# Patient Record
Sex: Male | Born: 1985 | Race: Black or African American | Hispanic: No | Marital: Single | Smoking: Current some day smoker
Health system: Southern US, Community
[De-identification: ages and names within clinical notes are randomized; demographics above are authoritative.]

## PROBLEM LIST (undated history)

## (undated) DIAGNOSIS — E119 Type 2 diabetes mellitus without complications: Secondary | ICD-10-CM

## (undated) DIAGNOSIS — I10 Essential (primary) hypertension: Secondary | ICD-10-CM

## (undated) DIAGNOSIS — E7800 Pure hypercholesterolemia, unspecified: Secondary | ICD-10-CM

## (undated) HISTORY — PX: TONSILLECTOMY: SUR1361

## (undated) HISTORY — PX: TYMPANOSTOMY TUBE PLACEMENT: SHX32

---

## 2017-03-24 ENCOUNTER — Emergency Department (HOSPITAL_BASED_OUTPATIENT_CLINIC_OR_DEPARTMENT_OTHER)
Admission: EM | Admit: 2017-03-24 | Discharge: 2017-03-24 | Disposition: A | Discharge status: Home / Self Care | Payer: Medicaid Other | Attending: Emergency Medicine | Admitting: Emergency Medicine

## 2017-03-24 ENCOUNTER — Encounter (HOSPITAL_BASED_OUTPATIENT_CLINIC_OR_DEPARTMENT_OTHER): Payer: Self-pay | Admitting: *Deleted

## 2017-03-24 DIAGNOSIS — E119 Type 2 diabetes mellitus without complications: Secondary | ICD-10-CM | POA: Diagnosis not present

## 2017-03-24 DIAGNOSIS — E118 Type 2 diabetes mellitus with unspecified complications: Secondary | ICD-10-CM

## 2017-03-24 DIAGNOSIS — Z87891 Personal history of nicotine dependence: Secondary | ICD-10-CM | POA: Diagnosis not present

## 2017-03-24 DIAGNOSIS — N4889 Other specified disorders of penis: Secondary | ICD-10-CM | POA: Diagnosis present

## 2017-03-24 DIAGNOSIS — N471 Phimosis: Secondary | ICD-10-CM | POA: Diagnosis not present

## 2017-03-24 DIAGNOSIS — I10 Essential (primary) hypertension: Secondary | ICD-10-CM | POA: Diagnosis not present

## 2017-03-24 HISTORY — DX: Essential (primary) hypertension: I10

## 2017-03-24 HISTORY — DX: Pure hypercholesterolemia, unspecified: E78.00

## 2017-03-24 LAB — URINALYSIS, ROUTINE W REFLEX MICROSCOPIC
BILIRUBIN URINE: NEGATIVE
Glucose, UA: >=500 mg/dL — AB
HGB URINE DIPSTICK: NEGATIVE
Ketones, ur: NEGATIVE mg/dL
Leukocytes, UA: NEGATIVE
Nitrite: NEGATIVE
PROTEIN: NEGATIVE mg/dL
Specific Gravity, Urine: 1.04 — ABNORMAL HIGH (ref 1.005–1.030)
pH: 5.5 (ref 5.0–8.0)

## 2017-03-24 LAB — CBG MONITORING, ED: Glucose-Capillary: 268 mg/dL — ABNORMAL HIGH (ref 65–99)

## 2017-03-24 LAB — URINALYSIS, MICROSCOPIC (REFLEX)

## 2017-03-24 MED ORDER — MUPIROCIN CALCIUM 2 % EX CREA: 1.0000 "application " | TOPICAL_CREAM | Freq: Three times a day (TID) | CUTANEOUS | 0 refills | Status: DC

## 2017-03-24 MED ORDER — OXYCODONE-ACETAMINOPHEN 5-325 MG PO TABS: 1.0000 | ORAL_TABLET | Freq: Four times a day (QID) | ORAL | 0 refills | Status: DC | PRN

## 2017-03-24 MED ORDER — LIDOCAINE 4 % EX CREA
TOPICAL_CREAM | CUTANEOUS | Status: AC
  Administered 2017-03-24: 1
  Filled 2017-03-24: qty 5

## 2017-03-24 MED ORDER — HYDROMORPHONE HCL 1 MG/ML IJ SOLN
1.0000 mg | Freq: Once | INTRAMUSCULAR | Status: AC
Start: 2017-03-24 — End: 2017-03-24
  Administered 2017-03-24: 1 mg via INTRAMUSCULAR
  Filled 2017-03-24: qty 1

## 2017-03-24 MED ORDER — OXYCODONE-ACETAMINOPHEN 5-325 MG PO TABS
2.0000 | ORAL_TABLET | Freq: Once | ORAL | Status: AC
  Administered 2017-03-24: 2 via ORAL
  Filled 2017-03-24: qty 2

## 2017-03-24 MED ORDER — LIDOCAINE-PRILOCAINE 2.5-2.5 % EX CREA
TOPICAL_CREAM | Freq: Once | CUTANEOUS | Status: DC
  Filled 2017-03-24: qty 5

## 2017-03-24 NOTE — ED Notes (Addendum)
Paged Dr. Isabel CapriceGrapey for consult @ [phone]

## 2017-03-24 NOTE — Discharge Instructions (Signed)
Apply ointment to skin on tip of penis 3 times daily for 1 week. Follow up with urology clinic. [facility] immediately if worsening symptoms or if you cannot urinate. FOLLOW UP WITH A PRIMARY CARE PROVIDER REGARDING YOUR DIABETES.

## 2017-03-24 NOTE — ED Provider Notes (Signed)
[facility] Provider Note   CSN: [id number] Arrival date & time: 03/24/17  1703  By signing my name below, I, Nathaniel Payne, attest that this documentation has been prepared under the direction and in the presence of Little, Ambrose Finland, MD. Electronically Signed: Rosario Payne, ED Scribe. 03/24/17. 6:06 PM.  History   Chief Complaint Chief Complaint  Patient presents with  . Groin Swelling   The history is provided by the patient. No language interpreter was used.    HPI Comments: Nathaniel Payne is a 31 y.o. male with a h/o HTN/HLD, who presents to the Emergency Department complaining of worsening penile pain and swelling beginning this afternoon approximately five hours ago. Per pt, he was bathing around 1PM today and pulled his foreskin back when it became stuck. He reports that prior to this for the past several days his foreskin has been tighter and not as easily retractable, but today after pulling it back it became struck with increased pain around the area. He attempted to massage the area, place A&D ointment, and soak it in peroxide and was able to pull the foreskin back in place; however, he notes that the interior portion of the foreskin "split" in several places. He took a nap shortly following but after waking up the area had become increasingly painful. He attempted to urinate at that time and reports that he had some difficulty beginning his stream and passing urine. He was able to pass the full void eventually.  Pt's pain is worse with manipulation of the foreskin and distal penis. No recent trauma to the groin. No h/o similar. No h/o DM. He denies penile discharge, dysuria, hematuria, or any other associated symptoms.   FH: father with Diabetes Mellitus Past Medical History:  Diagnosis Date  . Hypercholesteremia   . Hypertension    There are no active problems to display for this patient.  Past Surgical History:  Procedure Laterality Date  .  TONSILLECTOMY    . TYMPANOSTOMY TUBE PLACEMENT      Home Medications    Prior to Admission medications   Medication Sig Start Date End Date Taking? Authorizing Provider  mupirocin cream (BACTROBAN) 2 % Apply 1 application topically 3 (three) times daily. To tip of penis, for seven days 03/24/17   Little, Ambrose Finland, MD  oxyCODONE-acetaminophen (PERCOCET) 5-325 MG tablet Take 1-2 tablets by mouth every 6 (six) hours as needed for severe pain. 03/24/17   Little, Ambrose Finland, MD   Family History No family history on file.  Social History Social History  Substance Use Topics  . Smoking status: Former Games developer  . Smokeless tobacco: Never Used  . Alcohol use No   Allergies   Penicillins  Review of Systems Review of Systems A complete review of systems was obtained and all systems are negative except as noted in the HPI and PMH.   Physical Exam Updated Vital Signs BP (!) 138/103   Pulse 86   Temp 98.2 F (36.8 C) (Oral)   Resp 20   Ht 5\' 11"  (1.803 m)   SpO2 98%   Physical Exam  Constitutional: He is oriented to person, place, and time. He appears well-developed and well-nourished. He appears distressed.  HENT:  Head: Normocephalic and atraumatic.  Eyes: Conjunctivae are normal.  Neck: Neck supple.  Genitourinary:  Genitourinary Comments: Uncircumcised penis with edema of distal foreskin. Unable to fully retract foreskin with significant tenderness. Several splits in skin in internal foreskin. Normal appearing testes.  Neurological: He is alert and oriented to person, place, and time.  Skin: Skin is warm and dry.  Psychiatric: He has a normal mood and affect. Judgment normal.  Nursing note and vitals reviewed.  ED Treatments / Results  DIAGNOSTIC STUDIES: Oxygen Saturation is 98% on RA, normal by my interpretation.   COORDINATION OF CARE: 6:06 PM-Discussed next steps with pt. Pt verbalized understanding and is agreeable with the plan.   Labs (all labs ordered are  listed, but only abnormal results are displayed) Labs Reviewed  URINALYSIS, ROUTINE W REFLEX MICROSCOPIC - Abnormal; Notable for the following:       Result Value   Specific Gravity, Urine 1.040 (*)    Glucose, UA >=500 (*)    All other components within normal limits  URINALYSIS, MICROSCOPIC (REFLEX) - Abnormal; Notable for the following:    Bacteria, UA MANY (*)    Squamous Epithelial / LPF 0-5 (*)    All other components within normal limits  CBG MONITORING, ED - Abnormal; Notable for the following:    Glucose-Capillary 268 (*)    All other components within normal limits    EKG  EKG Interpretation None      Radiology No results found.  Procedures Procedures   Medications Ordered in ED Medications  lidocaine-prilocaine (EMLA) cream ( Topical Not Given 03/24/17 1824)  lidocaine (LMX) 4 % cream (1 application  Given 03/24/17 1824)  oxyCODONE-acetaminophen (PERCOCET/ROXICET) 5-325 MG per tablet 2 tablet (2 tablets Oral Given 03/24/17 1855)  HYDROmorphone (DILAUDID) injection 1 mg (1 mg Intramuscular Given 03/24/17 2017)    Initial Impression / Assessment and Plan / ED Course  I have reviewed the triage vital signs and the nursing notes.  Pertinent labs that were available during my care of the patient were reviewed by me and considered in my medical decision making (see chart for details).    Pt With foreskin that became stuck retracted after bathing. He was able to reduce it prior to arrival but has had ongoing severe pain. On exam, his foreskin was completely reduced, able to partially visualize tip of the glans but limited due to pain. He had several tears in the internal portion of foreskin. Applied EMLA and ice, gave Percocet initially and later IM Dilaudid.  The patient has been able to void here, UA showing large amount of glucose therefore obtained point-of-care blood glucose which was 268. I informed the patient that he is likely diabetic and it is very important for him  to follow closely with a primary care provider to initiate appropriate treatment. He has voiced understanding. I discussed his case with the urologist on call, Dr. Isabel Caprice, appreciate assistance. He agreed that it appeared foreskin was appropriately reduced found recommended follow-up in the urology clinic for discussion of definitive management. I did provide the patient with mupirocin ointment and extensively reviewed return cautions regarding signs of infection. Patient voiced understanding of treatment and follow-up plan and was discharged in satisfactory condition.  Final Clinical Impressions(s) / ED Diagnoses   Final diagnoses:  Phimosis  Type 2 diabetes mellitus with complication, without long-term current use of insulin (HCC)   New Prescriptions Discharge Medication List as of 03/24/2017  8:50 PM    START taking these medications   Details  mupirocin cream (BACTROBAN) 2 % Apply 1 application topically 3 (three) times daily. To tip of penis, for seven days, Starting Sun 03/24/2017, Print    oxyCODONE-acetaminophen (PERCOCET) 5-325 MG tablet Take 1-2 tablets by mouth every 6 (six)  hours as needed for severe pain., Starting Sun 03/24/2017, Print       I personally performed the services described in this documentation, which was scribed in my presence. The recorded information has been reviewed and is accurate.    Little, Ambrose Finlandachel Morgan, MD 03/24/17 2112

## 2017-03-24 NOTE — ED Triage Notes (Signed)
Pt reports he pulled foreskin back while showering around 1300 today and hasn't been able to pull foreskin back down. Reports difficulty urinating and penile swelling.

## 2017-03-28 ENCOUNTER — Emergency Department (HOSPITAL_BASED_OUTPATIENT_CLINIC_OR_DEPARTMENT_OTHER)
Admission: EM | Admit: 2017-03-28 | Discharge: 2017-03-28 | Disposition: A | Discharge status: Home / Self Care | Payer: Medicaid Other | Attending: Physician Assistant | Admitting: Physician Assistant

## 2017-03-28 DIAGNOSIS — N481 Balanitis: Secondary | ICD-10-CM | POA: Diagnosis not present

## 2017-03-28 DIAGNOSIS — I10 Essential (primary) hypertension: Secondary | ICD-10-CM | POA: Diagnosis not present

## 2017-03-28 DIAGNOSIS — Z87891 Personal history of nicotine dependence: Secondary | ICD-10-CM | POA: Insufficient documentation

## 2017-03-28 MED ORDER — OXYCODONE-ACETAMINOPHEN 5-325 MG PO TABS
1.0000 | ORAL_TABLET | Freq: Once | ORAL | Status: AC
  Administered 2017-03-28: 1 via ORAL
  Filled 2017-03-28: qty 1

## 2017-03-28 MED ORDER — CLINDAMYCIN HCL 150 MG PO CAPS
300.0000 mg | ORAL_CAPSULE | Freq: Once | ORAL | Status: AC
  Administered 2017-03-28: 300 mg via ORAL
  Filled 2017-03-28: qty 2

## 2017-03-28 MED ORDER — FLUCONAZOLE 50 MG PO TABS
150.0000 mg | ORAL_TABLET | Freq: Once | ORAL | Status: AC
  Administered 2017-03-28: 150 mg via ORAL
  Filled 2017-03-28: qty 1

## 2017-03-28 MED ORDER — CLINDAMYCIN HCL 150 MG PO CAPS: 300.0000 mg | ORAL_CAPSULE | Freq: Three times a day (TID) | ORAL | 0 refills | Status: AC

## 2017-03-28 MED FILL — CLINDAMYCIN HCL 150 MG CAPS: 150 | 10 days supply | Qty: 60 | Fill #0

## 2017-03-28 NOTE — ED Triage Notes (Signed)
Seen here on 5/6 for infected foreskin and prescribed antibiotics. Pt states was unable to fill Rx due to cost > $100. Saw urologist on 5/7. States last pain became severe and and swelling and drainage increased. Pt pacing in room, appears very uncomfortable. States he is able to void with pain

## 2017-03-28 NOTE — ED Provider Notes (Addendum)
[facility]   CSN: [id number] Arrival date & time: 03/28/17  1059     History   Chief Complaint Chief Complaint  Patient presents with  . Penis Pain    HPI Nathaniel Payne is a 31 y.o. male.  HPI   Pt is a 31 yo male presenting with balanitis. PT diagnosed earlier this week, started on mupiceron, [facility].    Pt did not fill abx, has had worsening pain and swelling. Still able to urinate.   Past Medical History:  Diagnosis Date  . Hypercholesteremia   . Hypertension     There are no active problems to display for this patient.   Past Surgical History:  Procedure Laterality Date  . TONSILLECTOMY    . TYMPANOSTOMY TUBE PLACEMENT         Home Medications    Prior to Admission medications   Medication Sig Start Date End Date Taking? Authorizing Provider  mupirocin cream (BACTROBAN) 2 % Apply 1 application topically 3 (three) times daily. To tip of penis, for seven days 03/24/17   Little, Nathaniel Finlandachel Morgan, MD  oxyCODONE-acetaminophen (PERCOCET) 5-325 MG tablet Take 1-2 tablets by mouth every 6 (six) hours as needed for severe pain. 03/24/17   Little, Nathaniel Finlandachel Morgan, MD    Family History No family history on file.  Social History Social History  Substance Use Topics  . Smoking status: Former Games developermoker  . Smokeless tobacco: Never Used  . Alcohol use No     Allergies   Penicillins   Review of Systems Review of Systems  Constitutional: Negative for activity change.  Respiratory: Negative for shortness of breath.   Cardiovascular: Negative for chest pain.  Gastrointestinal: Negative for abdominal pain.     Physical Exam Updated Vital Signs BP (!) 143/87 (BP Location: Right Arm)   Pulse (!) 101   Temp 98.2 F (36.8 C) (Oral)   Resp 18   Ht 5\' 11"  (1.803 m)   Wt 288 lb (130.6 kg)   SpO2 97%   BMI 40.17 kg/m   Physical Exam  Constitutional: He is oriented to person, place, and time. He appears  well-nourished.  HENT:  Head: Normocephalic.  Eyes: Conjunctivae are normal.  Cardiovascular: Normal rate and regular rhythm.   Pulmonary/Chest: Effort normal and breath sounds normal.  Genitourinary:  Genitourinary Comments: Swollen penis.Unable to retract patient's forskin urinate. Evidence of  white discharge.  No evidence of spreading cellulitis.   Neurological: He is oriented to person, place, and time.  Skin: Skin is warm and dry. He is not diaphoretic.  Psychiatric: He has a normal mood and affect. His behavior is normal.     ED Treatments / Results  Labs (all labs ordered are listed, but only abnormal results are displayed) Labs Reviewed - No data to display  EKG  EKG Interpretation None       Radiology No results found.  Procedures Procedures (including critical care time)  Medications Ordered in ED Medications - No data to display   Initial Impression / Assessment and Plan / ED Course  I have reviewed the triage vital signs and the nursing notes.  Pertinent labs & imaging results that were available during my care of the patient were reviewed by me and considered in my medical decision making (see chart for details).     Patient is a 31 year old male seen 5 days ago and diagnosed with phimosis and told to follow up with urology. He followed up  and they gave him prescriptions. He did not fill the prescription. Instead he's been taking a random antibiotic he had for tooth pain earlier. He is not followed about about his diabetes either.  Patient here for increased pain. Patient does have impressive swelling to the foreskin. However he still able urinate. I called his urologistoffice. With on-call provider Dr. Edwin Cap has an appointment at 2:30. I believe that referral to this specialist iis the appropriate next step.   Final Clinical Impressions(s) / ED Diagnoses   Final diagnoses:  None    New Prescriptions New Prescriptions   No medications on file       Abelino Derrick, MD 03/28/17 1209    Abelino Derrick, MD 03/28/17 1244

## 2017-03-28 NOTE — ED Notes (Signed)
Pt directed to pharmacy to pick up Rx. Pt verbalizes understabding to see urologist at 230pm today

## 2017-03-28 NOTE — Discharge Instructions (Signed)
It is important you follow-up with urologist today 2: 30 p.m. you have the risk of losing your penis, so please start taking antibiotics and following the directions as prescribed by physician.  We have arranged for a cheaper perscription for antibiotics (15 dolalrs)

## 2020-07-19 ENCOUNTER — Emergency Department (HOSPITAL_BASED_OUTPATIENT_CLINIC_OR_DEPARTMENT_OTHER): Payer: Medicaid Other

## 2020-07-19 ENCOUNTER — Encounter (HOSPITAL_BASED_OUTPATIENT_CLINIC_OR_DEPARTMENT_OTHER): Payer: Self-pay | Admitting: *Deleted

## 2020-07-19 ENCOUNTER — Other Ambulatory Visit: Payer: Self-pay

## 2020-07-19 ENCOUNTER — Emergency Department (HOSPITAL_BASED_OUTPATIENT_CLINIC_OR_DEPARTMENT_OTHER)
Admission: EM | Admit: 2020-07-19 | Discharge: 2020-07-19 | Disposition: A | Discharge status: Home / Self Care | Payer: Medicaid Other | Attending: Emergency Medicine | Admitting: Emergency Medicine

## 2020-07-19 DIAGNOSIS — R2232 Localized swelling, mass and lump, left upper limb: Secondary | ICD-10-CM | POA: Insufficient documentation

## 2020-07-19 DIAGNOSIS — M25522 Pain in left elbow: Secondary | ICD-10-CM | POA: Diagnosis present

## 2020-07-19 DIAGNOSIS — Z5321 Procedure and treatment not carried out due to patient leaving prior to being seen by health care provider: Secondary | ICD-10-CM | POA: Diagnosis not present

## 2020-07-19 NOTE — ED Triage Notes (Signed)
C/o left elbow pain and swelling x 4 days, denies injury

## 2021-10-08 IMAGING — DX DG ELBOW COMPLETE 3+V*L*
4 series · 4 of 4 positions shown · non-contrast
Comparison: None.

CLINICAL DATA: Pain and swelling

EXAM:
LEFT ELBOW - COMPLETE 3+ VIEW

[elbow ap]
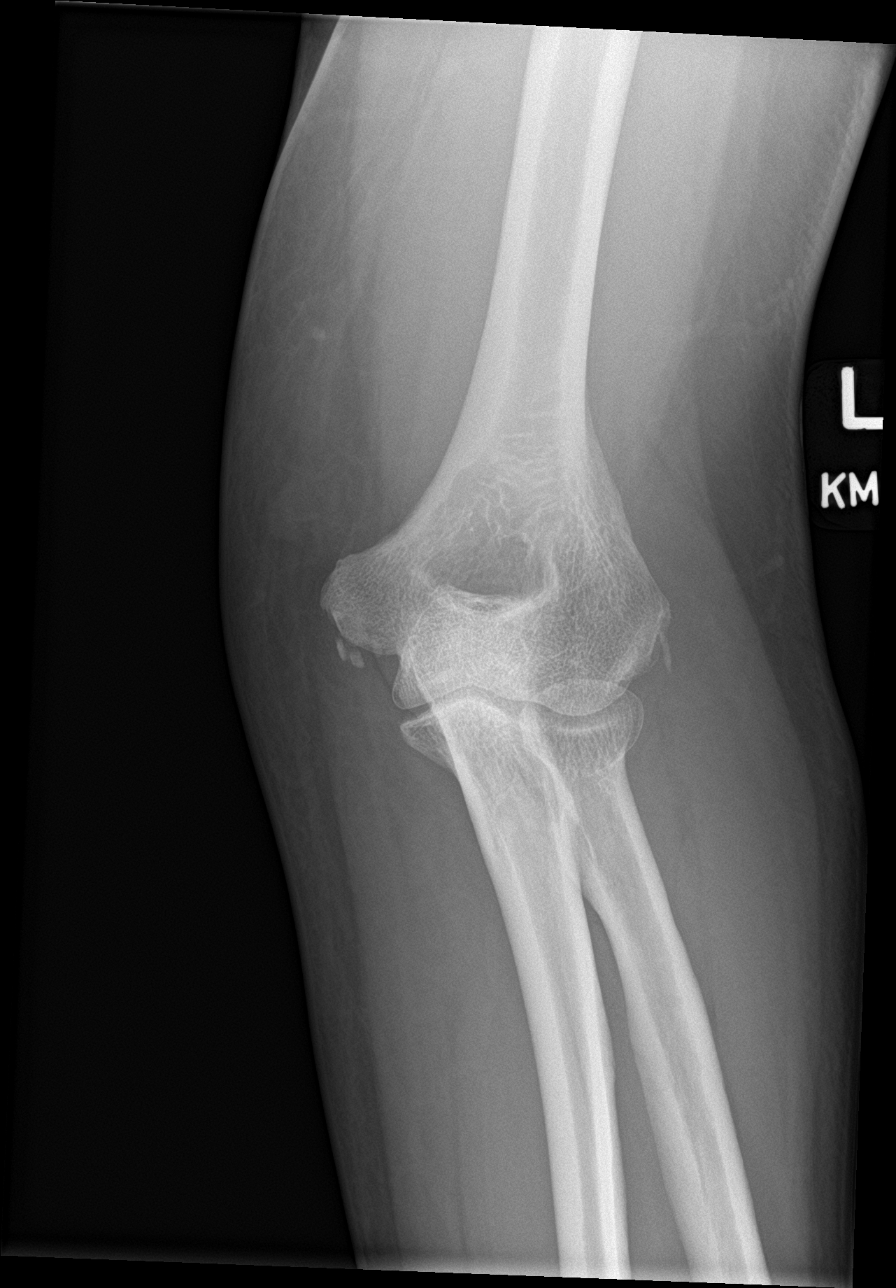

[elbow obl (1 of 2)]
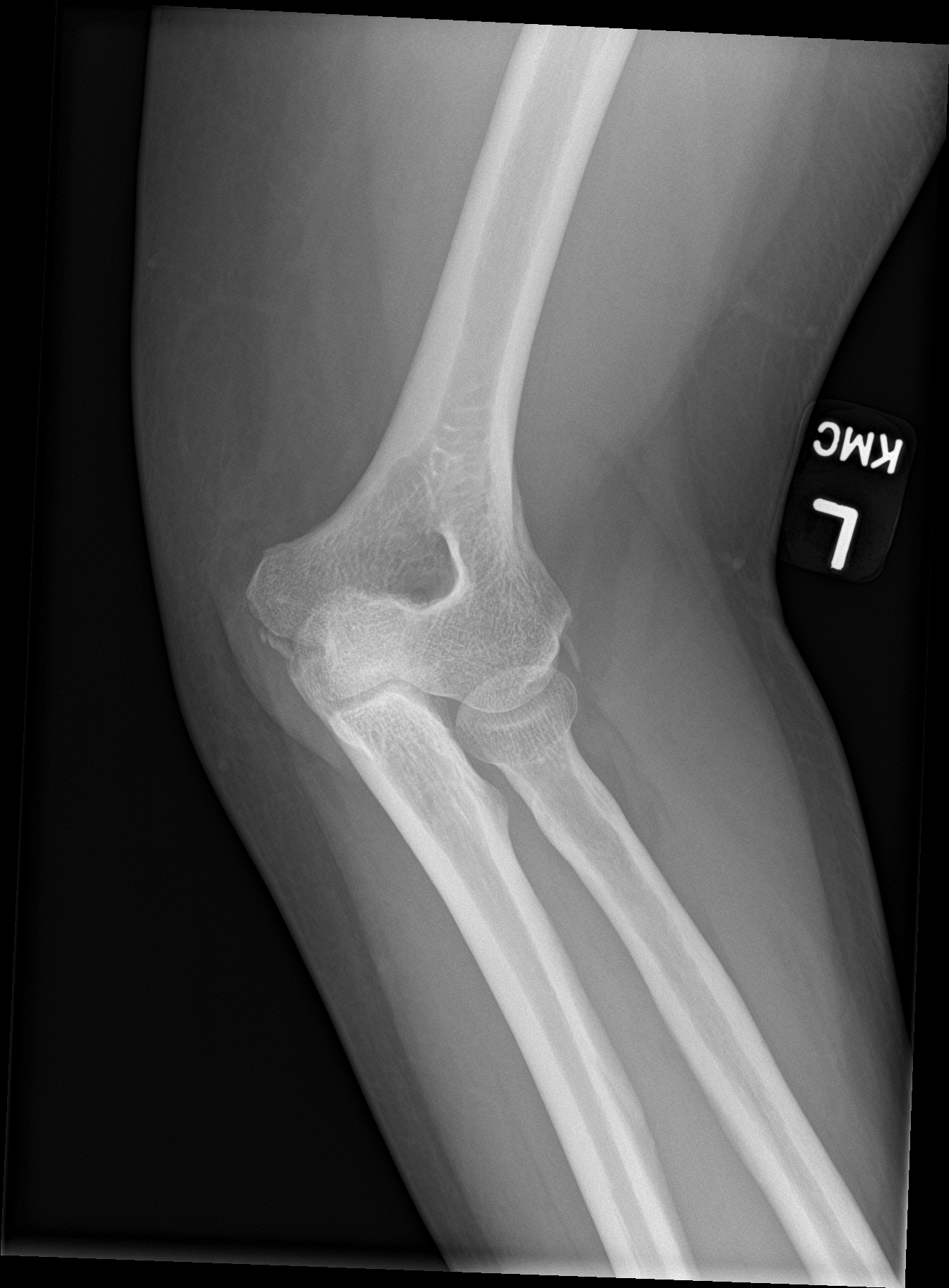

[elbow obl (2 of 2)]
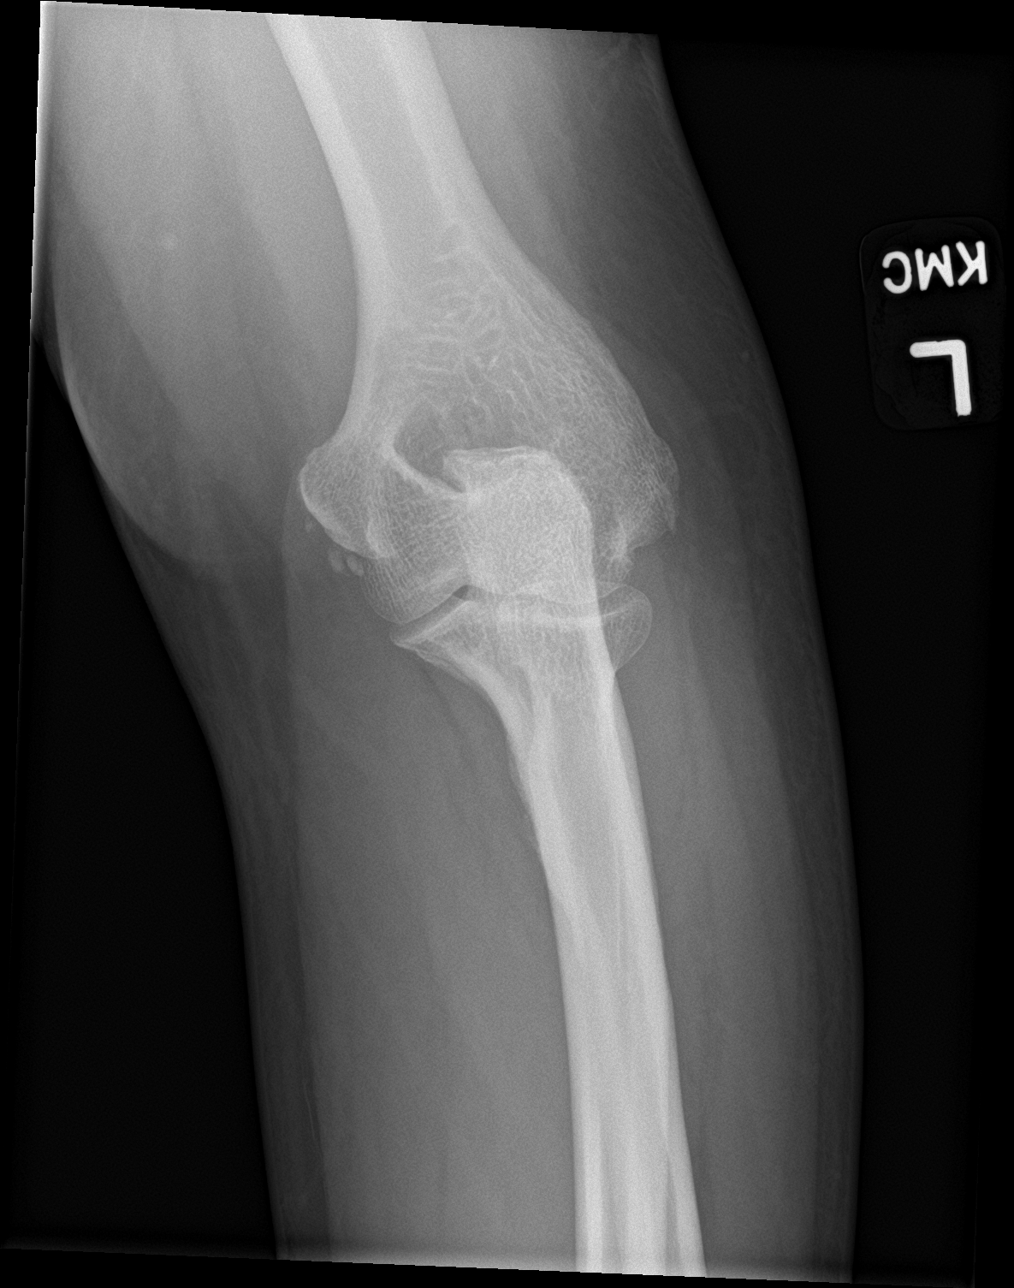

[elbow lat]
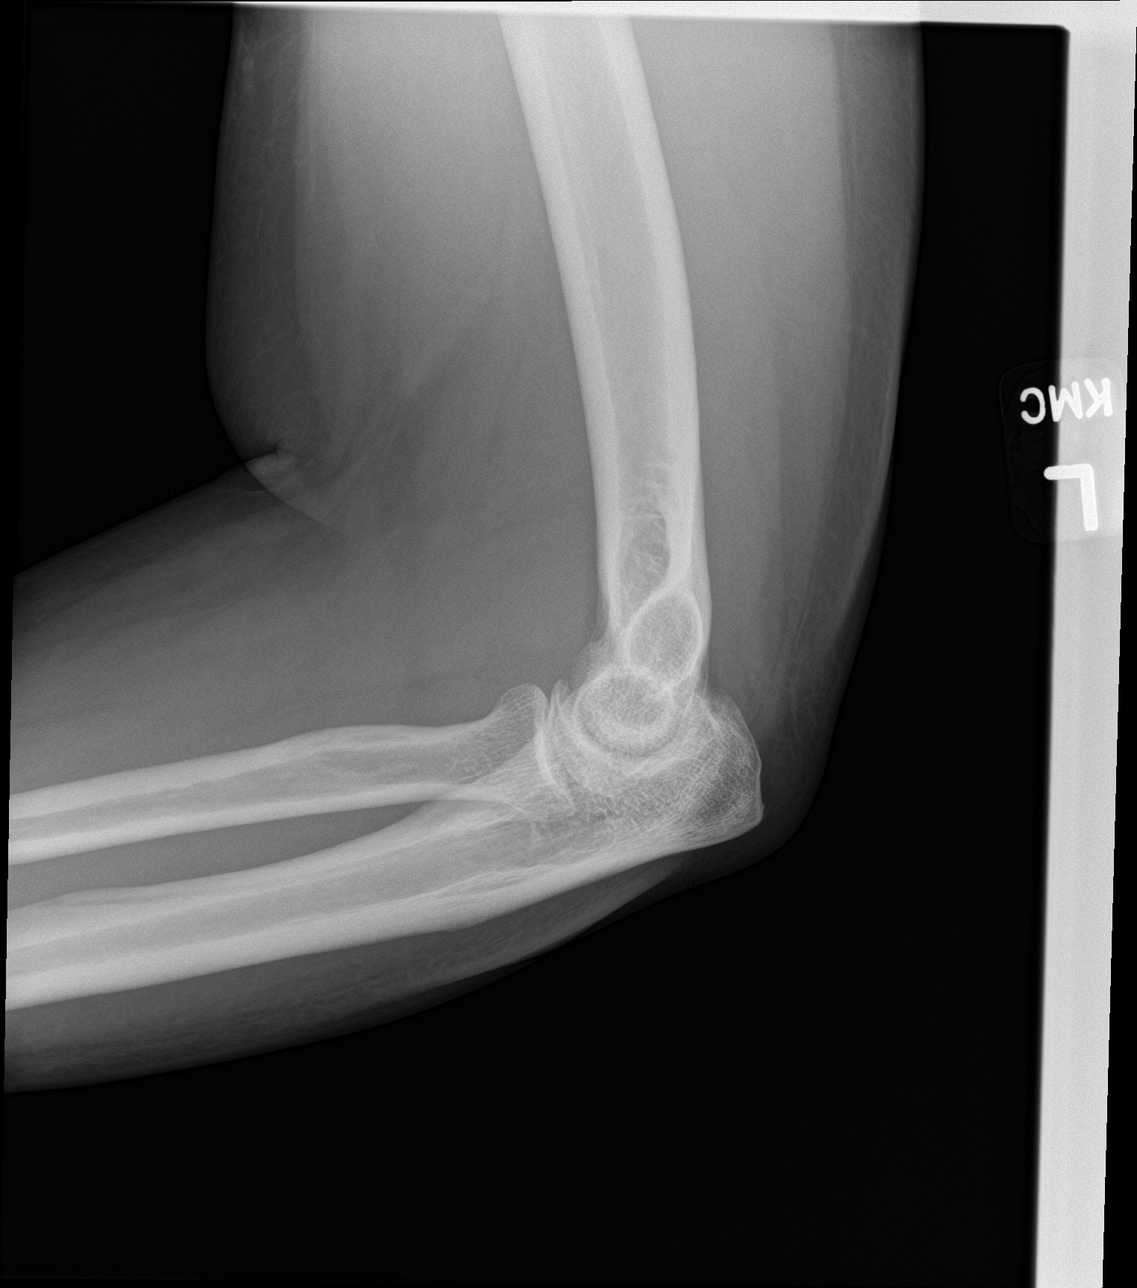

[4 of 4 positions shown; findings below may reference images not displayed]

FINDINGS: There is no evidence of fracture, dislocation, or joint effusion.
There is no evidence of arthropathy or other focal bone abnormality.
Soft tissues are unremarkable.
IMPRESSION: Negative.

## 2022-03-17 ENCOUNTER — Emergency Department (HOSPITAL_BASED_OUTPATIENT_CLINIC_OR_DEPARTMENT_OTHER)
Admission: EM | Admit: 2022-03-17 | Discharge: 2022-03-17 | Disposition: A | Discharge status: Home / Self Care | Payer: PRIVATE HEALTH INSURANCE | Attending: Emergency Medicine | Admitting: Emergency Medicine

## 2022-03-17 ENCOUNTER — Other Ambulatory Visit: Payer: Self-pay

## 2022-03-17 ENCOUNTER — Encounter (HOSPITAL_BASED_OUTPATIENT_CLINIC_OR_DEPARTMENT_OTHER): Payer: Self-pay | Admitting: Emergency Medicine

## 2022-03-17 ENCOUNTER — Emergency Department (HOSPITAL_BASED_OUTPATIENT_CLINIC_OR_DEPARTMENT_OTHER): Payer: PRIVATE HEALTH INSURANCE

## 2022-03-17 DIAGNOSIS — S0093XA Contusion of unspecified part of head, initial encounter: Secondary | ICD-10-CM | POA: Insufficient documentation

## 2022-03-17 DIAGNOSIS — W208XXA Other cause of strike by thrown, projected or falling object, initial encounter: Secondary | ICD-10-CM | POA: Insufficient documentation

## 2022-03-17 DIAGNOSIS — S0990XA Unspecified injury of head, initial encounter: Secondary | ICD-10-CM | POA: Diagnosis present

## 2022-03-17 DIAGNOSIS — R519 Headache, unspecified: Secondary | ICD-10-CM

## 2022-03-17 HISTORY — DX: Type 2 diabetes mellitus without complications: E11.9

## 2022-03-17 NOTE — ED Provider Notes (Signed)
?[facility] EMERGENCY DEPARTMENT ?Provider Note ? ? ?CSN: [id number] ?Arrival date & time: 03/17/22  1129 ? ?  ? ?History ? ?Chief Complaint  ?Patient presents with  ? Head Injury  ? ? ?Nathaniel Payne is a 36 y.o. male. ? ?Patient with no significant past medical history presents to the emergency department for evaluation of head injury.  Patient was at work this morning at 9 AM when a ceiling grate fell off of the ceiling and struck him on the right side of the head and face.  He denies loss of consciousness.  He has had a headache since that time.  No vomiting.  No vision changes or vision loss.  Patient is tender to the right temple area and the right jaw area.  No dental injury.  No neck pain.  No weakness, numbness, or tingling in the arms of the legs.  He has not taken any medication prior to arrival. ? ? ?  ? ?Home Medications ?Prior to Admission medications   ?Not on File  ?   ? ?Allergies    ?Penicillins   ? ?Review of Systems   ?Review of Systems ? ?Physical Exam ?Updated Vital Signs ?BP (!) 138/93   Pulse 93   Temp 97.9 ?F (36.6 ?C) (Oral)   Resp 18   Ht 5\' 11"  (1.803 m)   Wt 122.5 kg   SpO2 100%   BMI 37.66 kg/m?  ?Physical Exam ?Vitals and nursing note reviewed.  ?Constitutional:   ?   Appearance: He is well-developed.  ?HENT:  ?   Head: Normocephalic and atraumatic. No raccoon eyes or Battle's sign.  ?   Comments: Tenderness to palpation without deformity or ecchymosis to the right temporal area as well as the TMJ area.  Patient has full range of motion of the jaw at the TMJ with some discomfort but no clicking or popping.  No deformities palpated.  No tenderness over the zygoma or the angle of the mandible. ?   Right Ear: Tympanic membrane, ear canal and external ear normal. No hemotympanum.  ?   Left Ear: Tympanic membrane, ear canal and external ear normal. No hemotympanum.  ?   Nose: Nose normal.  ?Eyes:  ?   General: Lids are normal.  ?   Conjunctiva/sclera: Conjunctivae normal.  ?    Pupils: Pupils are equal, round, and reactive to light.  ?   Comments: No visible hyphema  ?Cardiovascular:  ?   Rate and Rhythm: Normal rate and regular rhythm.  ?Pulmonary:  ?   Effort: Pulmonary effort is normal.  ?   Breath sounds: Normal breath sounds.  ?Abdominal:  ?   Palpations: Abdomen is soft.  ?   Tenderness: There is no abdominal tenderness.  ?Musculoskeletal:     ?   General: Normal range of motion.  ?   Cervical back: Normal range of motion and neck supple. No tenderness or bony tenderness.  ?   Thoracic back: No tenderness or bony tenderness.  ?   Lumbar back: No tenderness or bony tenderness.  ?   Comments: Full range of motion of cervical spine without any discomfort.  No cervical spine or upper back tenderness.  ?Skin: ?   General: Skin is warm and dry.  ?Neurological:  ?   Mental Status: He is alert and oriented to person, place, and time.  ?   GCS: GCS eye subscore is 4. GCS verbal subscore is 5. GCS motor subscore is 6.  ?  Cranial Nerves: No cranial nerve deficit.  ?   Sensory: No sensory deficit.  ?   Coordination: Coordination normal.  ?   Deep Tendon Reflexes: Reflexes are normal and symmetric.  ? ? ?ED Results / Procedures / Treatments   ?Labs ?(all labs ordered are listed, but only abnormal results are displayed) ?Labs Reviewed - No data to display ? ?EKG ?None ? ?Radiology ?CT Head Wo Contrast ? ?Result Date: 03/17/2022 ?CLINICAL DATA:  Struck by falling ceiling tile in RIGHT-side of face and head EXAM: CT HEAD WITHOUT CONTRAST CT MAXILLOFACIAL WITHOUT CONTRAST TECHNIQUE: Multidetector CT imaging of the head and maxillofacial structures were performed using the standard protocol without intravenous contrast. Multiplanar CT image reconstructions of the maxillofacial structures were also generated. Right side of face marked with BB. RADIATION DOSE REDUCTION: This exam was performed according to the departmental dose-optimization program which includes automated exposure control, adjustment  of the mA and/or kV according to patient size and/or use of iterative reconstruction technique. COMPARISON:  CT head 07/20/2018 FINDINGS: CT HEAD FINDINGS Brain: Normal ventricular morphology. No midline shift or mass effect. Normal appearance of brain parenchyma. No intracranial hemorrhage, mass lesion, evidence of acute infarction, or extra-axial fluid collection. Vascular: No hyperdense vessels Skull: Calvaria intact Other: N/A CT MAXILLOFACIAL FINDINGS Osseous: Facial bones and visualized calvaria intact. Visualized cervical spine intact. Orbits: Clear Sinuses: Clear Soft tissues: No significant abnormalities IMPRESSION: Normal CT head. Normal CT facial bones. Electronically Signed   By: Ulyses Southward M.D.   On: 03/17/2022 12:53  ? ?CT Maxillofacial Wo Contrast ? ?Result Date: 03/17/2022 ?CLINICAL DATA:  Struck by falling ceiling tile in RIGHT-side of face and head EXAM: CT HEAD WITHOUT CONTRAST CT MAXILLOFACIAL WITHOUT CONTRAST TECHNIQUE: Multidetector CT imaging of the head and maxillofacial structures were performed using the standard protocol without intravenous contrast. Multiplanar CT image reconstructions of the maxillofacial structures were also generated. Right side of face marked with BB. RADIATION DOSE REDUCTION: This exam was performed according to the departmental dose-optimization program which includes automated exposure control, adjustment of the mA and/or kV according to patient size and/or use of iterative reconstruction technique. COMPARISON:  CT head 07/20/2018 FINDINGS: CT HEAD FINDINGS Brain: Normal ventricular morphology. No midline shift or mass effect. Normal appearance of brain parenchyma. No intracranial hemorrhage, mass lesion, evidence of acute infarction, or extra-axial fluid collection. Vascular: No hyperdense vessels Skull: Calvaria intact Other: N/A CT MAXILLOFACIAL FINDINGS Osseous: Facial bones and visualized calvaria intact. Visualized cervical spine intact. Orbits: Clear  Sinuses: Clear Soft tissues: No significant abnormalities IMPRESSION: Normal CT head. Normal CT facial bones. Electronically Signed   By: Ulyses Southward M.D.   On: 03/17/2022 12:53   ? ?Procedures ?Procedures  ? ? ?Medications Ordered in ED ?Medications - No data to display ? ?ED Course/ Medical Decision Making/ A&P ?  ? ?Patient seen and examined. History obtained directly from patient. Work-up including labs, imaging, EKG ordered in triage, if performed, were reviewed.   ? ?Labs/EKG: None ordered ? ?Imaging: Independently reviewed and interpreted.  CT head and maxillofacial bones.  Negative. ? ?Medications/Fluids: Offered ibuprofen/Tylenol, patient declines. ? ?Most recent vital signs reviewed and are as follows: ?BP (!) 117/92   Pulse 81   Temp (!) 97.5 ?F (36.4 ?C) (Oral)   Resp 17   Ht 5\' 11"  (1.803 m)   Wt 122.5 kg   SpO2 98%   BMI 37.66 kg/m?  ? ?Initial impression: Minor head injury, contusion. ? ?Home treatment plan: Discussed concussion precautions and  need to follow-up if residual symptoms are persistent.  Encouraged RICE protocol, OTC meds for pain. ? ?Return instructions discussed with patient: Patient was counseled on head injury precautions and symptoms that should indicate their return to the ED.  These include severe worsening headache, vision changes, confusion, loss of consciousness, trouble walking, nausea & vomiting, or weakness/tingling in extremities.   ? ?Follow-up instructions discussed with patient: Encouraged occupational health/PCP follow-up in 48 to 72 hours if symptoms persist. ? ?                        ?Medical Decision Making ?Amount and/or Complexity of Data Reviewed ?Radiology: ordered. ? ? ?Patient with minor head injury.  CT imaging of the head and facial bones negative for fracture.  No other acute interventions needed at this time.  Patient counseled on concussion precautions and symptomatic care.  No concern for cervical spine injury.  No signs of jaw fracture or  intracranial hemorrhage, skull fracture. ? ? ? ? ? ? ? ?Final Clinical Impression(s) / ED Diagnoses ?Final diagnoses:  ?Minor head injury, initial encounter  ?Facial pain  ? ? ?Rx / DC Orders ?ED Discharge Orders   ? ? None  ? ?

## 2022-03-17 NOTE — Discharge Instructions (Signed)
Please read and follow all provided instructions. ? ?Your diagnoses today include:  ?1. Minor head injury, initial encounter   ?2. Facial pain   ? ? ?Tests performed today include: ?CT scan of your head and facial bones that did not show any serious injury. ?Vital signs. See below for your results today.  ? ?Medications prescribed:  ?Please use over-the-counter NSAID medications (ibuprofen, naproxen) as directed on the packaging for pain if you do not have any reasons not to take these medications just as weak kidneys or a history of bleeding in your stomach or gut.  ? ?Take any prescribed medications only as directed. ? ?Home care instructions:  ?Follow any educational materials contained in this packet. ? ?BE VERY CAREFUL not to take multiple medicines containing Tylenol (also called acetaminophen). Doing so can lead to an overdose which can damage your liver and cause liver failure and possibly death.  ? ?Follow-up instructions: ?Please follow-up with your primary care provider or the occupational health referral in the next 3 days for further evaluation of your symptoms especially if not improving. ? ?Return instructions:  ?SEEK IMMEDIATE MEDICAL ATTENTION IF: ?There is confusion or drowsiness (although children frequently become drowsy after injury).  ?You cannot awaken the injured person.  ?You have more than one episode of vomiting.  ?You notice dizziness or unsteadiness which is getting worse, or inability to walk.  ?You have convulsions or unconsciousness.  ?You experience severe, persistent headaches not relieved by Tylenol. ?You cannot use arms or legs normally.  ?There are changes in pupil sizes. (This is the black center in the colored part of the eye)  ?There is clear or bloody discharge from the nose or ears.  ?You have change in speech, vision, swallowing, or understanding.  ?Localized weakness, numbness, tingling, or change in bowel or bladder control. ?You have any other emergent  concerns. ? ?Additional Information: ?You have had a head injury which does not appear to require admission at this time. ? ?Your vital signs today were: ?BP (!) 138/93   Pulse 93   Temp 97.9 ?F (36.6 ?C) (Oral)   Resp 18   Ht 5\' 11"  (1.803 m)   Wt 122.5 kg   SpO2 100%   BMI 37.66 kg/m?  ?If your blood pressure (BP) was elevated above 135/85 this visit, please have this repeated by your doctor within one month. ?-------------- ? ?

## 2022-03-17 NOTE — ED Triage Notes (Signed)
Pt arrives pov, steady gait with c/o ceiling tile falling on head and right side of face. Pt denies loc. Denies neck pain, endorses HA ?

## 2022-03-17 NOTE — ED Notes (Addendum)
Patient states that he was at work today and  the vent fell out the ceil and hit him in his head. Patient states that his pain is a seven States pain is on frontal lobe and rt side of face.Denies any visual changes. ?

## 2022-11-17 ENCOUNTER — Other Ambulatory Visit: Payer: Self-pay

## 2022-11-17 ENCOUNTER — Emergency Department (HOSPITAL_BASED_OUTPATIENT_CLINIC_OR_DEPARTMENT_OTHER)
Admission: EM | Admit: 2022-11-17 | Discharge: 2022-11-17 | Discharge status: Against Medical Advice | Payer: Medicaid Other | Attending: Family Medicine | Admitting: Family Medicine

## 2022-11-17 ENCOUNTER — Encounter (HOSPITAL_BASED_OUTPATIENT_CLINIC_OR_DEPARTMENT_OTHER): Payer: Self-pay | Admitting: Emergency Medicine

## 2022-11-17 DIAGNOSIS — Z5321 Procedure and treatment not carried out due to patient leaving prior to being seen by health care provider: Secondary | ICD-10-CM | POA: Insufficient documentation

## 2022-11-17 DIAGNOSIS — S90822A Blister (nonthermal), left foot, initial encounter: Secondary | ICD-10-CM | POA: Diagnosis present

## 2022-11-17 DIAGNOSIS — X58XXXA Exposure to other specified factors, initial encounter: Secondary | ICD-10-CM | POA: Insufficient documentation

## 2022-11-17 DIAGNOSIS — E1165 Type 2 diabetes mellitus with hyperglycemia: Secondary | ICD-10-CM | POA: Diagnosis not present

## 2022-11-17 LAB — CBG MONITORING, ED: Glucose-Capillary: 382 mg/dL — ABNORMAL HIGH (ref 70–99)

## 2022-11-17 NOTE — ED Triage Notes (Signed)
Pt woke up this am with blisters on L distal foot. Denies any injury. Denies exposure to hot/cold. Decreased sensation to toes. Hx of diabetes

## 2022-11-17 NOTE — ED Notes (Signed)
Called in WR, no answer, 2nd call 

## 2022-11-17 NOTE — ED Notes (Signed)
Called x 3, in WR. 3rd call, no answer

## 2023-04-08 ENCOUNTER — Emergency Department (HOSPITAL_BASED_OUTPATIENT_CLINIC_OR_DEPARTMENT_OTHER)
Admission: EM | Admit: 2023-04-08 | Discharge: 2023-04-09 | Disposition: A | Discharge status: Home / Self Care | Payer: BLUE CROSS/BLUE SHIELD | Attending: Emergency Medicine | Admitting: Emergency Medicine

## 2023-04-08 ENCOUNTER — Encounter (HOSPITAL_BASED_OUTPATIENT_CLINIC_OR_DEPARTMENT_OTHER): Payer: Self-pay | Admitting: Emergency Medicine

## 2023-04-08 ENCOUNTER — Other Ambulatory Visit: Payer: Self-pay

## 2023-04-08 DIAGNOSIS — K029 Dental caries, unspecified: Secondary | ICD-10-CM | POA: Diagnosis not present

## 2023-04-08 DIAGNOSIS — K0889 Other specified disorders of teeth and supporting structures: Secondary | ICD-10-CM | POA: Diagnosis present

## 2023-04-08 NOTE — ED Triage Notes (Signed)
Patient arrived via POV c/o right side dental pain x 4 days. Patient states pain to upper and lower molars. Patient has 10/10 pain. Patient is ao x 4, vs w/ elevated bp, normal gait.

## 2023-04-09 DIAGNOSIS — K029 Dental caries, unspecified: Secondary | ICD-10-CM | POA: Diagnosis not present

## 2023-04-09 MED ORDER — CLINDAMYCIN HCL 300 MG PO CAPS: 300.0000 mg | ORAL_CAPSULE | Freq: Four times a day (QID) | ORAL | 0 refills | Status: AC

## 2023-04-09 MED ORDER — LIDOCAINE VISCOUS HCL 2 % MT SOLN
15.0000 mL | Freq: Once | OROMUCOSAL | Status: AC
  Administered 2023-04-09: 15 mL via OROMUCOSAL
  Filled 2023-04-09: qty 15

## 2023-04-09 MED ORDER — NAPROXEN 250 MG PO TABS
500.0000 mg | ORAL_TABLET | Freq: Once | ORAL | Status: AC
  Administered 2023-04-09: 500 mg via ORAL
  Filled 2023-04-09: qty 2

## 2023-04-09 MED ORDER — CLINDAMYCIN HCL 150 MG PO CAPS
300.0000 mg | ORAL_CAPSULE | Freq: Once | ORAL | Status: AC
  Administered 2023-04-09: 300 mg via ORAL
  Filled 2023-04-09: qty 2

## 2023-04-09 MED ORDER — MAGIC MOUTHWASH: 10.0000 mL | Freq: Three times a day (TID) | ORAL | 0 refills | Status: AC | PRN

## 2023-04-09 NOTE — ED Notes (Signed)
Pt c/o dental pain has tried to see dentist and they are not accepting pts or not accepting MCD

## 2023-04-09 NOTE — ED Provider Notes (Addendum)
[facility] Provider Note   CSN: [id number] Arrival date & time: 04/08/23  2330     History  Chief Complaint  Patient presents with   Dental Pain    Nathaniel Payne is a 37 y.o. male.  The history is provided by the patient.  Dental Pain Location:  Upper and lower Upper teeth location:  2/RU 2nd molar, 3/RU 1st molar and 4/RU 2nd bicuspid Lower teeth location:  30/RL 1st molar, 31/RL 2nd molar and 29/RL 2nd bicuspid Quality:  Aching Severity:  Severe Onset quality:  Gradual Duration:  4 days Timing:  Constant Progression:  Worsening Context: crown fracture, dental caries and poor dentition   Relieved by:  Nothing Worsened by:  Nothing Ineffective treatments:  None tried Associated symptoms: no drooling, no facial swelling and no fever        Home Medications Prior to Admission medications   Medication Sig Start Date End Date Taking? Authorizing Provider  clindamycin (CLEOCIN) 300 MG capsule Take 1 capsule (300 mg total) by mouth 4 (four) times daily. X 7 days 04/09/23  Yes Venise Ellingwood, MD  magic mouthwash SOLN Take 10 mLs by mouth 3 (three) times daily as needed for mouth pain. Swish and spit 04/09/23  Yes Lovell Roe, MD      Allergies    Penicillins    Review of Systems   Review of Systems  Constitutional:  Negative for fever.  HENT:  Positive for dental problem. Negative for drooling and facial swelling.   Eyes:  Negative for redness.  Respiratory:  Negative for wheezing and stridor.   Gastrointestinal:  Negative for vomiting.  All other systems reviewed and are negative.   Physical Exam Updated Vital Signs BP (!) 197/112 (BP Location: Left Arm)   Pulse (!) 106   Resp 20   Ht 5\' 11"  (1.803 m)   Wt 113.4 kg   SpO2 100%   BMI 34.87 kg/m  Physical Exam Vitals and nursing note reviewed.  Constitutional:      General: He is not in acute distress.    Appearance: Normal appearance. He is well-developed.  He is not diaphoretic.  HENT:     Head: Normocephalic and atraumatic.     Nose: Nose normal.     Mouth/Throat:     Comments: Diffuse dental disease with ellis 2 fractures and necrosis B Eyes:     Conjunctiva/sclera: Conjunctivae normal.     Pupils: Pupils are equal, round, and reactive to light.  Cardiovascular:     Rate and Rhythm: Normal rate and regular rhythm.  Pulmonary:     Effort: Pulmonary effort is normal.     Breath sounds: Normal breath sounds. No wheezing or rales.  Abdominal:     General: Bowel sounds are normal.     Palpations: Abdomen is soft.     Tenderness: There is no abdominal tenderness. There is no guarding or rebound.  Musculoskeletal:        General: Normal range of motion.     Cervical back: Normal range of motion and neck supple.  Skin:    General: Skin is warm and dry.     Capillary Refill: Capillary refill takes less than 2 seconds.  Neurological:     General: No focal deficit present.     Mental Status: He is alert and oriented to person, place, and time.  Psychiatric:        Mood and Affect: Mood normal.     ED  Results / Procedures / Treatments   Labs (all labs ordered are listed, but only abnormal results are displayed) Labs Reviewed - No data to display  EKG None  Radiology No results found.  Procedures Procedures    Medications Ordered in ED Medications  clindamycin (CLEOCIN) capsule 300 mg (has no administration in time range)  lidocaine (XYLOCAINE) 2 % viscous mouth solution 15 mL (has no administration in time range)  naproxen (NAPROSYN) tablet 500 mg (has no administration in time range)    ED Course/ Medical Decision Making/ A&P                             Medical Decision Making Dental caries for 4 days   Amount and/or Complexity of Data Reviewed Independent Historian: friend    Details: See above  External Data Reviewed: notes.    Details: Previous notes reviewed  Risk Prescription drug management. Risk  Details: Well appearing, multiple caries.  Will need close follow up with a dentist.  Stable for discharge with close follow up.      Final Clinical Impression(s) / ED Diagnoses Final diagnoses:  Pain due to dental caries  Return for intractable cough, coughing up blood, fevers > 100.4 unrelieved by medication, shortness of breath, intractable vomiting, chest pain, shortness of breath, weakness, numbness, changes in speech, facial asymmetry, abdominal pain, passing out, Inability to tolerate liquids or food, cough, altered mental status or any concerns. No signs of systemic illness or infection. The patient is nontoxic-appearing on exam and vital signs are within normal limits.  I have reviewed the triage vital signs and the nursing notes. Pertinent labs & imaging results that were available during my care of the patient were reviewed by me and considered in my medical decision making (see chart for details). After history, exam, and medical workup I feel the patient has been appropriately medically screened and is safe for discharge home. Pertinent diagnoses were discussed with the patient. Patient was given return precautions.   Rx / DC Orders ED Discharge Orders          Ordered    clindamycin (CLEOCIN) 300 MG capsule  4 times daily        04/09/23 0023    magic mouthwash SOLN  3 times daily PRN        04/09/23 0023                Kaiyana Bedore, MD 04/09/23 0028
